# Patient Record
Sex: Male | Born: 1970 | Race: Black or African American | Hispanic: No | Marital: Single | Smoking: Current every day smoker
Health system: Southern US, Community
[De-identification: ages and names within clinical notes are randomized; demographics above are authoritative.]

## PROBLEM LIST (undated history)

## (undated) ENCOUNTER — Emergency Department (HOSPITAL_COMMUNITY): Admission: EM | Discharge status: Absent without leave | Payer: Self-pay

## (undated) DIAGNOSIS — F39 Unspecified mood [affective] disorder: Secondary | ICD-10-CM

## (undated) DIAGNOSIS — F209 Schizophrenia, unspecified: Secondary | ICD-10-CM

## (undated) DIAGNOSIS — R011 Cardiac murmur, unspecified: Secondary | ICD-10-CM

## (undated) DIAGNOSIS — F319 Bipolar disorder, unspecified: Secondary | ICD-10-CM

## (undated) DIAGNOSIS — R053 Chronic cough: Secondary | ICD-10-CM

## (undated) DIAGNOSIS — J069 Acute upper respiratory infection, unspecified: Secondary | ICD-10-CM

## (undated) DIAGNOSIS — R05 Cough: Secondary | ICD-10-CM

---

## 2014-10-25 NOTE — ED Notes (Signed)
[facility], c/o HI toward neighbors and SI w/o a plan.  Pt reports several [facility] admissions in the past.  Pt has been off all medications x "a long time."

## 2014-10-26 ENCOUNTER — Encounter (HOSPITAL_COMMUNITY): Payer: Self-pay | Admitting: Emergency Medicine

## 2014-10-26 ENCOUNTER — Emergency Department (HOSPITAL_COMMUNITY)
Admission: EM | Admit: 2014-10-26 | Discharge: 2014-10-26 | Disposition: A | Discharge status: Home / Self Care | Payer: Medicaid Other | Attending: Emergency Medicine | Admitting: Emergency Medicine

## 2014-10-26 DIAGNOSIS — F39 Unspecified mood [affective] disorder: Secondary | ICD-10-CM | POA: Diagnosis not present

## 2014-10-26 DIAGNOSIS — Z008 Encounter for other general examination: Secondary | ICD-10-CM | POA: Diagnosis present

## 2014-10-26 DIAGNOSIS — R011 Cardiac murmur, unspecified: Secondary | ICD-10-CM | POA: Diagnosis not present

## 2014-10-26 HISTORY — DX: Bipolar disorder, unspecified: F31.9

## 2014-10-26 HISTORY — DX: Schizophrenia, unspecified: F20.9

## 2014-10-26 HISTORY — DX: Cardiac murmur, unspecified: R01.1

## 2014-10-26 NOTE — ED Notes (Signed)
[facility] and wanted to be placed back on lexapro because he has been "lashing out at people' lately. Pt was told [facility] that since he hasn't been on lexapro for over 5 years that [facility]. Pt denies SI/HI at this time. Pt does not want to have to stay, he just wants to be able to be placed back on lexapro.

## 2014-10-26 NOTE — Discharge Instructions (Signed)
[facility] issues. Return immediately if you have any thought of harming yourself or anyone else

## 2014-10-26 NOTE — ED Provider Notes (Signed)
CSN: [id number]     Arrival date & time 10/26/14  1742 History   First MD Initiated Contact with Patient 10/26/14 1845     Chief Complaint  Patient presents with  . Medical Clearance   Patient [facility]" [facility]  (Consider location/radiation/quality/duration/timing/severity/associated sxs/prior Treatment) HPI [facility] Requesting to be on Lexapro, which she's been on in the past to help with mood disorder. He states he gets angry easily. He presently denies any wish to harm himself or others, and is asymptomatic. No treatment prior to coming here. He was on Lexapro 5 years ago and wishes to start treatment again for anger management. Past Medical History  Diagnosis Date  . Bipolar 1 disorder   . Schizophrenia   . Heart murmur    History reviewed. No pertinent past surgical history. No family history on file. History  Substance Use Topics  . Smoking status: Never Smoker   . Smokeless tobacco: Not on file  . Alcohol Use: No    admits to marijuana use. Denies other drug use Review of Systems  Constitutional: Negative.   HENT: Negative.   Respiratory: Negative.   Cardiovascular: Negative.   Gastrointestinal: Negative.   Musculoskeletal: Negative.   Skin: Negative.   Neurological: Negative.   Psychiatric/Behavioral: Positive for behavioral problems.  All other systems reviewed and are negative.     Allergies  Review of patient's allergies indicates no known allergies.  Home Medications   Prior to Admission medications   Not on File   BP 134/94 mmHg  Pulse 80  Temp(Src) 98 F (36.7 C) (Oral)  Resp 17  SpO2 98% Physical Exam  Constitutional: He is oriented to person, place, and time. He appears well-developed and well-nourished.  HENT:  Head: Normocephalic and atraumatic.  Eyes: Conjunctivae are normal. Pupils are equal, round, and reactive to light.  Neck: Neck supple. No tracheal deviation  present. No thyromegaly present.  Cardiovascular: Normal rate and regular rhythm.   No murmur heard. Pulmonary/Chest: Effort normal and breath sounds normal.  Abdominal: Soft. Bowel sounds are normal. He exhibits no distension. There is no tenderness.  Musculoskeletal: Normal range of motion. He exhibits no edema or tenderness.  Neurological: He is alert and oriented to person, place, and time. No cranial nerve deficit. Coordination normal.  Gait normal  Skin: Skin is warm and dry. No rash noted.  Psychiatric: He has a normal mood and affect.  Nursing note and vitals reviewed.   ED Course  Procedures (including critical care time) Labs Review Labs Reviewed  CBC WITH DIFFERENTIAL/PLATELET  COMPREHENSIVE METABOLIC PANEL  URINE RAPID DRUG SCREEN (HOSP PERFORMED)  ETHANOL    Imaging Review No results found.   EKG Interpretation None      MDM  Patient does not require laboratory evaluation. He admits to marijuana use. Final diagnoses:  None   [facility] mood disorder      Doug SouSam Charlesia Canaday, MD 10/27/14 [id number]

## 2014-10-31 ENCOUNTER — Emergency Department (HOSPITAL_COMMUNITY): Payer: Medicaid Other

## 2014-10-31 ENCOUNTER — Encounter (HOSPITAL_COMMUNITY): Payer: Self-pay | Admitting: *Deleted

## 2014-10-31 ENCOUNTER — Emergency Department (HOSPITAL_COMMUNITY)
Admission: EM | Admit: 2014-10-31 | Discharge: 2014-10-31 | Disposition: A | Discharge status: Home / Self Care | Payer: Medicaid Other | Attending: Emergency Medicine | Admitting: Emergency Medicine

## 2014-10-31 DIAGNOSIS — Z8659 Personal history of other mental and behavioral disorders: Secondary | ICD-10-CM | POA: Diagnosis not present

## 2014-10-31 DIAGNOSIS — Z720 Tobacco use: Secondary | ICD-10-CM | POA: Diagnosis not present

## 2014-10-31 DIAGNOSIS — R011 Cardiac murmur, unspecified: Secondary | ICD-10-CM | POA: Insufficient documentation

## 2014-10-31 DIAGNOSIS — R059 Cough, unspecified: Secondary | ICD-10-CM

## 2014-10-31 DIAGNOSIS — R05 Cough: Secondary | ICD-10-CM

## 2014-10-31 DIAGNOSIS — J069 Acute upper respiratory infection, unspecified: Secondary | ICD-10-CM | POA: Diagnosis not present

## 2014-10-31 NOTE — Discharge Instructions (Signed)
Continue to stay well-hydrated. Gargle warm salt water and spit it out. Continue to alternate between Tylenol and Ibuprofen for pain or fever. Use Mucinex for cough suppression/expectoration of mucus. Use netipot and flonase to help with nasal congestion. May consider over-the-counter Benadryl or other antihistamine to decrease secretions and for watery itchy eyes. Followup with your primary care doctor in 5-7 days for recheck of ongoing symptoms. Return to emergency department for emergent changing or worsening of symptoms.   Cough, Adult  A cough is a reflex. It helps you clear your throat and airways. A cough can help heal your body. A cough can last 2 or 3 weeks (acute) or may last more than 8 weeks (chronic). Some common causes of a cough can include an infection, allergy, or a cold. HOME CARE  Only take medicine as told by your doctor.  If given, take your medicines (antibiotics) as told. Finish them even if you start to feel better.  Use a cold steam vaporizer or humidifier in your home. This can help loosen thick spit (secretions).  Sleep so you are almost sitting up (semi-upright). Use pillows to do this. This helps reduce coughing.  Rest as needed.  Stop smoking if you smoke. GET HELP RIGHT AWAY IF:  You have yellowish-white fluid (pus) in your thick spit.  Your cough gets worse.  Your medicine does not reduce coughing, and you are losing sleep.  You cough up blood.  You have trouble breathing.  Your pain gets worse and medicine does not help.  You have a fever. MAKE SURE YOU:   Understand these instructions.  Will watch your condition.  Will get help right away if you are not doing well or get worse. Document Released: 02/26/2011 Document Revised: 10/30/2013 Document Reviewed: 02/26/2011 [facility] Patient Information 2015 [place], [place]. This information is not intended to replace advice given to you by your health care provider. Make sure you discuss any questions  you have with your health care provider.  Cool Mist Vaporizers Vaporizers may help relieve the symptoms of a cough and cold. They add moisture to the air, which helps mucus to become thinner and less sticky. This makes it easier to breathe and cough up secretions. Cool mist vaporizers do not cause serious burns like hot mist vaporizers, which may also be called steamers or humidifiers. Vaporizers have not been proven to help with colds. You should not use a vaporizer if you are allergic to mold. HOME CARE INSTRUCTIONS  Follow the package instructions for the vaporizer.  Do not use anything other than distilled water in the vaporizer.  Do not run the vaporizer all of the time. This can cause mold or bacteria to grow in the vaporizer.  Clean the vaporizer after each time it is used.  Clean and dry the vaporizer well before storing it.  Stop using the vaporizer if worsening respiratory symptoms develop. Document Released: 03/12/2004 Document Revised: 06/20/2013 Document Reviewed: 11/02/2012 [facility] Patient Information 2015 [place], [place]. This information is not intended to replace advice given to you by your health care provider. Make sure you discuss any questions you have with your health care provider.  Upper Respiratory Infection, Adult An upper respiratory infection (URI) is also known as the common cold. It is often caused by a type of germ (virus). Colds are easily spread (contagious). You can pass it to others by kissing, coughing, sneezing, or drinking out of the same glass. Usually, you get better in 1 or 2 weeks.  HOME CARE  Only take medicine as told by your doctor.  Use a warm mist humidifier or breathe in steam from a hot shower.  Drink enough water and fluids to keep your pee (urine) clear or pale yellow.  Get plenty of rest.  Return to work when your temperature is back to normal or as told by your doctor. You may use a face mask and wash your hands to stop your cold  from spreading. GET HELP RIGHT AWAY IF:   After the first few days, you feel you are getting worse.  You have questions about your medicine.  You have chills, shortness of breath, or brown or red spit (mucus).  You have yellow or brown snot (nasal discharge) or pain in the face, especially when you bend forward.  You have a fever, puffy (swollen) neck, pain when you swallow, or white spots in the back of your throat.  You have a bad headache, ear pain, sinus pain, or chest pain.  You have a high-pitched whistling sound when you breathe in and out (wheezing).  You have a lasting cough or cough up blood.  You have sore muscles or a stiff neck. MAKE SURE YOU:   Understand these instructions.  Will watch your condition.  Will get help right away if you are not doing well or get worse. Document Released: 12/02/2007 Document Revised: 09/07/2011 Document Reviewed: 09/20/2013 [facility] Patient Information 2015 [place], [place]. This information is not intended to replace advice given to you by your health care provider. Make sure you discuss any questions you have with your health care provider.

## 2014-10-31 NOTE — ED Notes (Signed)
The pt has had a cough for 5 days with yellow mucoous.  No temp  More congested at night.  And his girlfriend thinks his outer neck is swollen

## 2014-10-31 NOTE — ED Provider Notes (Signed)
CSN: [id number]     Arrival date & time 10/31/14  2059 History  This chart was scribed for Tyrone StraussMercedes Camprubi-Soms, PA-C, working with Tyrone BerkshireJoseph Zammit, MD by Chestine SporeSoijett Blue, ED Scribe. The patient was seen in room TR10C/TR10C at 10:16 PM.    Chief Complaint  Patient presents with  . Cough      Patient is a 44 y.o. male presenting with cough. The history is provided by the patient. No language interpreter was used.  Cough Cough characteristics:  Productive Sputum characteristics:  Yellow Severity:  Moderate Onset quality:  Gradual Duration:  5 days Timing:  Constant Progression:  Unchanged Chronicity:  New Smoker: yes (cigars)   Context: upper respiratory infection   Context: not sick contacts   Relieved by: benadryl. Worsened by:  Nothing tried Ineffective treatments:  None tried Associated symptoms: shortness of breath (due to cough), sinus congestion and wheezing (due to cough)   Associated symptoms: no chest pain, no chills, no ear fullness, no ear pain, no eye discharge, no fever, no headaches, no myalgias, no rash, no rhinorrhea and no sore throat   Risk factors: no recent travel     Tyrone Best is a 44 y.o. male with a medical hx of bipolar 1 disorder, schizophrenia, and heart murmur, who presents to the Emergency department complaining of cough onset 5 days. Pt cough is productive of yellow sputum and he notes that he is more congested at night. He states that he has tried benadryl with mild relief for his symptoms. He states that he is having associated symptoms of congestion, wheezing due to the cough, SOB due to the cough. Pt reports that his cough is worse at night.  He denies sore throat, trouble swallowing, drooling/trismus, ear pain/drainage, sinus congestion/rhinorrhea, CP, fever, chills, n/v/d, abdominal pain, leg swelling, and any other symptoms. Pt notes that he is a smoker of cigars. Pt denies sick contacts or recent travel. Denies medical hx of asthma or COPD.   Past  Medical History  Diagnosis Date  . Bipolar 1 disorder   . Schizophrenia   . Heart murmur    History reviewed. No pertinent past surgical history. No family history on file. History  Substance Use Topics  . Smoking status: Current Every Day Smoker  . Smokeless tobacco: Not on file  . Alcohol Use: No    Review of Systems  Constitutional: Negative for fever and chills.  HENT: Positive for congestion (chest). Negative for ear discharge, ear pain, rhinorrhea, sinus pressure, sore throat and trouble swallowing.   Eyes: Negative for discharge and itching.  Respiratory: Positive for cough, shortness of breath (due to cough) and wheezing (due to cough).   Cardiovascular: Negative for chest pain and leg swelling.  Gastrointestinal: Negative for nausea, vomiting, abdominal pain, diarrhea and constipation.  Musculoskeletal: Negative for myalgias, arthralgias and neck pain.  Skin: Negative for rash.  Allergic/Immunologic: Negative for immunocompromised state.  Neurological: Negative for weakness, numbness and headaches.  Hematological: Positive for adenopathy (neck).   A complete 10 system review of systems was obtained and all systems are negative except as noted in the HPI and PMH.    Allergies  Review of patient's allergies indicates no known allergies.  Home Medications   Prior to Admission medications   Not on File   BP 118/60 mmHg  Pulse 68  Temp(Src) 98.6 F (37 C)  Resp 18  SpO2 96%  Physical Exam  Constitutional: He is oriented to person, place, and time. Vital signs are normal. He  appears well-developed and well-nourished.  Non-toxic appearance. No distress.  Afebrile, nontoxic, NAD  HENT:  Head: Normocephalic and atraumatic.  Right Ear: Hearing, tympanic membrane, external ear and ear canal normal.  Left Ear: Hearing, tympanic membrane, external ear and ear canal normal.  Nose: Nose normal.  Mouth/Throat: Uvula is midline, oropharynx is clear and moist and mucous  membranes are normal. No trismus in the jaw. No uvula swelling.  Ears clear bilaterally, nose clear. Oropharynx clear without tonsillar swelling, exudate, or erythema. Uvula midline without swelling, no trismus, or drooling.   Eyes: Conjunctivae and EOM are normal. Right eye exhibits no discharge. Left eye exhibits no discharge.  Neck: Normal range of motion. Neck supple.  Cardiovascular: Normal rate, regular rhythm, normal heart sounds and intact distal pulses.  Exam reveals no gallop and no friction rub.   No murmur heard. Pulmonary/Chest: Effort normal and breath sounds normal. No respiratory distress. He has no decreased breath sounds. He has no wheezes. He has no rhonchi. He has no rales.  CTAB in all lung fields, no w/r/r, no hypoxia or increased WOB, speaking in full sentences, SpO2 96% on RA. Intermittent cough noted on exam.    Abdominal: Soft. Normal appearance and bowel sounds are normal. He exhibits no distension. There is no tenderness. There is no rigidity, no rebound and no guarding.  Musculoskeletal: Normal range of motion.  Lymphadenopathy:       Head (right side): No submandibular and no tonsillar adenopathy present.       Head (left side): No submandibular and no tonsillar adenopathy present.    He has cervical adenopathy.  Shotty cervical LAD bilaterally which is non-TTP. No submandibular or tonsillar LAD.   Neurological: He is alert and oriented to person, place, and time. He has normal strength. No sensory deficit.  Skin: Skin is warm, dry and intact. No rash noted.  Psychiatric: He has a normal mood and affect.  Nursing note and vitals reviewed.   ED Course  Procedures (including critical care time) DIAGNOSTIC STUDIES: Oxygen Saturation is 96% on RA, nl by my interpretation.    COORDINATION OF CARE: 10:21 PM-Discussed treatment plan which includes remain hydrated, rest, and f/u if the symptoms persists and worsen with pt at bedside and pt agreed to plan.   Labs  Review Labs Reviewed - No data to display  Imaging Review Dg Chest 2 View  10/31/2014   CLINICAL DATA:  Productive cough produced annual sputum for 5 days  EXAM: CHEST  2 VIEW  COMPARISON:  None.  FINDINGS: Normal mediastinum and cardiac silhouette. Normal pulmonary vasculature. No evidence of effusion, infiltrate, or pneumothorax. No acute bony abnormality.  IMPRESSION: Normal chest radiograph   Electronically Signed   By: Genevive BiStewart  Edmunds M.D.   On: 10/31/2014 21:57     EKG Interpretation None      MDM   Final diagnoses:  Cough  URI (upper respiratory infection)    44 y.o. male here with cough and shotty cervical LAD. Doubt strep or mono. Pt is afebrile with a clear lung exam. Likely viral URI. CXR obtained in triage negative. No hx of asthma/COPD, therefore doubt need for nebs or prednisone. Pt is agreeable to symptomatic treatment with close follow up with PCP as needed but spoke at length about emergent changing or worsening of symptoms that should prompt return to ER. Pt voices understanding and is agreeable to plan. Stable at time of discharge. Smoking cessation counseled.  I personally performed the services described in this  documentation, which was scribed in my presence. The recorded information has been reviewed and is accurate.  BP 118/60 mmHg  Pulse 68  Temp(Src) 98.6 F (37 C)  Resp 18  SpO2 96%    Sunita Demond Camprubi-Soms, PA-C 10/31/14 2231  Tyrone Berkshire, MD 11/01/14 1320

## 2014-11-04 ENCOUNTER — Emergency Department (HOSPITAL_COMMUNITY)
Admission: EM | Admit: 2014-11-04 | Discharge: 2014-11-04 | Disposition: A | Discharge status: Home / Self Care | Payer: Medicaid Other | Attending: Emergency Medicine | Admitting: Emergency Medicine

## 2014-11-04 ENCOUNTER — Encounter (HOSPITAL_COMMUNITY): Payer: Self-pay | Admitting: Emergency Medicine

## 2014-11-04 DIAGNOSIS — R011 Cardiac murmur, unspecified: Secondary | ICD-10-CM | POA: Insufficient documentation

## 2014-11-04 DIAGNOSIS — R1013 Epigastric pain: Secondary | ICD-10-CM | POA: Diagnosis not present

## 2014-11-04 DIAGNOSIS — Z79899 Other long term (current) drug therapy: Secondary | ICD-10-CM | POA: Insufficient documentation

## 2014-11-04 DIAGNOSIS — Z8659 Personal history of other mental and behavioral disorders: Secondary | ICD-10-CM | POA: Insufficient documentation

## 2014-11-04 DIAGNOSIS — Z8709 Personal history of other diseases of the respiratory system: Secondary | ICD-10-CM | POA: Diagnosis not present

## 2014-11-04 DIAGNOSIS — R109 Unspecified abdominal pain: Secondary | ICD-10-CM

## 2014-11-04 DIAGNOSIS — Z720 Tobacco use: Secondary | ICD-10-CM | POA: Insufficient documentation

## 2014-11-04 HISTORY — DX: Chronic cough: R05.3

## 2014-11-04 HISTORY — DX: Unspecified mood (affective) disorder: F39

## 2014-11-04 HISTORY — DX: Acute upper respiratory infection, unspecified: J06.9

## 2014-11-04 HISTORY — DX: Cough: R05

## 2014-11-04 LAB — CBC WITH DIFFERENTIAL/PLATELET
Basophils Absolute: 0.1 10*3/uL (ref 0.0–0.1)
Basophils Relative: 1 % (ref 0–1)
Eosinophils Absolute: 0.4 10*3/uL (ref 0.0–0.7)
Eosinophils Relative: 6 % — ABNORMAL HIGH (ref 0–5)
HCT: 41.6 % (ref 39.0–52.0)
Hemoglobin: 15.8 g/dL (ref 13.0–17.0)
Lymphocytes Relative: 52 % — ABNORMAL HIGH (ref 12–46)
Lymphs Abs: 3.3 10*3/uL (ref 0.7–4.0)
MCH: 30 pg (ref 26.0–34.0)
MCHC: 37.4 g/dL — ABNORMAL HIGH (ref 30.0–36.0)
MCV: 80 fL (ref 78.0–100.0)
Monocytes Absolute: 0.5 10*3/uL (ref 0.1–1.0)
Monocytes Relative: 8 % (ref 3–12)
Neutro Abs: 2.1 10*3/uL (ref 1.7–7.7)
Neutrophils Relative %: 33 % — ABNORMAL LOW (ref 43–77)
Platelets: 179 10*3/uL (ref 150–400)
RBC: 5.2 MIL/uL (ref 4.22–5.81)
RDW: 15 % (ref 11.5–15.5)
WBC: 6.3 10*3/uL (ref 4.0–10.5)

## 2014-11-04 LAB — URINALYSIS, ROUTINE W REFLEX MICROSCOPIC
Bilirubin Urine: NEGATIVE
Glucose, UA: NEGATIVE mg/dL
Hgb urine dipstick: NEGATIVE
Ketones, ur: 15 mg/dL — AB
Leukocytes, UA: NEGATIVE
Nitrite: NEGATIVE
Protein, ur: NEGATIVE mg/dL
Specific Gravity, Urine: 1.027 (ref 1.005–1.030)
Urobilinogen, UA: 1 mg/dL (ref 0.0–1.0)
pH: 6.5 (ref 5.0–8.0)

## 2014-11-04 LAB — COMPREHENSIVE METABOLIC PANEL
ALT: 13 U/L — ABNORMAL LOW (ref 17–63)
AST: 22 U/L (ref 15–41)
Albumin: 3.8 g/dL (ref 3.5–5.0)
Alkaline Phosphatase: 63 U/L (ref 38–126)
Anion gap: 8 (ref 5–15)
BUN: 8 mg/dL (ref 6–20)
CO2: 27 mmol/L (ref 22–32)
Calcium: 9.2 mg/dL (ref 8.9–10.3)
Chloride: 102 mmol/L (ref 101–111)
Creatinine, Ser: 1.13 mg/dL (ref 0.61–1.24)
GFR calc Af Amer: >60 mL/min (ref >60)
GFR calc non Af Amer: >60 mL/min (ref >60)
Glucose, Bld: 96 mg/dL (ref 70–99)
Potassium: 3.8 mmol/L (ref 3.5–5.1)
Sodium: 137 mmol/L (ref 135–145)
Total Bilirubin: 0.9 mg/dL (ref 0.3–1.2)
Total Protein: 7.1 g/dL (ref 6.5–8.1)

## 2014-11-04 LAB — LIPASE, BLOOD: Lipase: 49 U/L (ref 22–51)

## 2014-11-04 MED ORDER — FAMOTIDINE 20 MG PO TABS
20.0000 mg | ORAL_TABLET | Freq: Once | ORAL | Status: AC
  Administered 2014-11-04: 20 mg via ORAL
  Filled 2014-11-04: qty 1

## 2014-11-04 MED ORDER — GI COCKTAIL ~~LOC~~
30.0000 mL | Freq: Once | ORAL | Status: AC
  Administered 2014-11-04: 30 mL via ORAL
  Filled 2014-11-04: qty 30

## 2014-11-04 MED ORDER — PANTOPRAZOLE SODIUM 40 MG PO TBEC: 40.0000 mg | DELAYED_RELEASE_TABLET | Freq: Two times a day (BID) | ORAL | Status: AC

## 2014-11-04 MED ORDER — PANTOPRAZOLE SODIUM 40 MG PO TBEC
40.0000 mg | DELAYED_RELEASE_TABLET | Freq: Once | ORAL | Status: AC
  Administered 2014-11-04: 40 mg via ORAL
  Filled 2014-11-04: qty 1

## 2014-11-04 NOTE — Discharge Instructions (Signed)
Abdominal Pain °Many things can cause abdominal pain. Usually, abdominal pain is not caused by a disease and will improve without treatment. It can often be observed and treated at home. Your health care provider will do a physical exam and possibly order blood tests and X-rays to help determine the seriousness of your pain. However, in many cases, more time must pass before a clear cause of the pain can be found. Before that point, your health care provider may not know if you need more testing or further treatment. °HOME CARE INSTRUCTIONS  °Monitor your abdominal pain for any changes. The following actions may help to alleviate any discomfort you are experiencing: °· Only take over-the-counter or prescription medicines as directed by your health care provider. °· Do not take laxatives unless directed to do so by your health care provider. °· Try a clear liquid diet (broth, tea, or water) as directed by your health care provider. Slowly move to a bland diet as tolerated. °SEEK MEDICAL CARE IF: °· You have unexplained abdominal pain. °· You have abdominal pain associated with nausea or diarrhea. °· You have pain when you urinate or have a bowel movement. °· You experience abdominal pain that wakes you in the night. °· You have abdominal pain that is worsened or improved by eating food. °· You have abdominal pain that is worsened with eating fatty foods. °· You have a fever. °SEEK IMMEDIATE MEDICAL CARE IF:  °· Your pain does not go away within 2 hours. °· You keep throwing up (vomiting). °· Your pain is felt only in portions of the abdomen, such as the right side or the left lower portion of the abdomen. °· You pass bloody or black tarry stools. °MAKE SURE YOU: °· Understand these instructions.   °· Will watch your condition.   °· Will get help right away if you are not doing well or get worse.   °Document Released: 03/25/2005 Document Revised: 06/20/2013 Document Reviewed: 02/22/2013 °ExitCare® Patient Information  ©2015 [employer]. This information is not intended to replace advice given to you by your health care provider. Make sure you discuss any questions you have with your health care provider. ° ° °Emergency Department Resource Guide °1) Find a Doctor and Pay Out of Pocket °Although you won't have to find out who is covered by your insurance plan, it is a good idea to ask around and get recommendations. You will then need to call the office and see if the doctor you have chosen will accept you as a new patient and what types of options they offer for patients who are self-pay. Some doctors offer discounts or will set up payment plans for their patients who do not have insurance, but you will need to ask so you aren't surprised when you get to your appointment. ° °2) Contact Your Local Health Department °Not all health departments have doctors that can see patients for sick visits, but many do, so it is worth a call to see if yours does. If you don't know where your local health department is, you can check in your phone book. The CDC also has a tool to help you locate your state's health department, and many state websites also have listings of all of their local health departments. ° °3) Find a Walk-in Clinic °If your illness is not likely to be very severe or complicated, you may want to try a walk in clinic. These are popping up all over the country in pharmacies, drugstores, and shopping centers. They're   usually staffed by nurse practitioners or physician assistants that have been trained to treat common illnesses and complaints. They're usually fairly quick and inexpensive. However, if you have serious medical issues or chronic medical problems, these are probably not your best option. ° °No Primary Care Doctor: °- Call [facility] at  832-8000 - they can help you locate a primary care doctor that  accepts your insurance, provides certain services, etc. °- Physician Referral Service- 1-800-533-3463 ° °Chronic  Pain Problems: °Organization         Address  Phone   Notes  °[facility]  (336) 297-2271 Patients need to be referred by their primary care doctor.  ° °Medication Assistance: °Organization         Address  Phone   Notes  °[place] [facility] [address] °[place], [place] [address] (336) 641-8030 --Must be a resident of [place] °-- Must have NO insurance coverage whatsoever (no Medicaid/ Medicare, etc.) °-- The pt. MUST have a primary care doctor that directs their care regularly and follows them in the community °  °[facility]  (866) 331-1348   °[employer]  (888) 892-1162   ° °Agencies that provide inexpensive medical care: °Organization         Address  Phone   Notes  °[facility]  (336) 832-8035   °[facility]    (336) 832-7272   °[facility] [address] °[place], [place] [address] (336) 832-4777   °[facility] [address], °[place] (336) 271-4999   °[facility]    (336) 373-0678   °[facility]    (336) 272-1050   °[facility] ° [address], [place] Phone:  (336) 832-4444, Fax:  (336) 832-4440 Hours of Operation:  9 am - 6 pm, M-F.  Also accepts Medicaid/Medicare and self-pay.  °[facility] ° [address], [place] Phone: (336) 832-3150, Fax: (336) 832-3151. Hours of Operation:  8:30 am - 5:30 pm, M-F.  Also accepts Medicaid and self-pay.  °[facility] [place] [address], [place] Phone: (336) 878-6027   °[facility] [address], [place], [place] (336)723-1848, Ext. 123 Mondays & Thursdays: 7-9 AM.  First 15 patients are seen on a first come, first serve basis. °  ° °Medicaid-accepting [place] Providers: ° °Organization         Address  Phone   Notes  °[facility] [address], [place] (336) 641-2100 Also  accepts self-pay patients.  °[facility] [address], [place] ° (336) 856-9996   °[facility] [address], [place] (336) 288-8857   °[facility] [address] [place] [address], [place] (336) 299-7000   °Veita Bland [address], [place]  ° (336) 373-1557 Only accepts [place] Access Medicaid patients after they have their name applied to their card.  ° °Self-Pay (no insurance) in [place]: ° °Organization         Address  Phone   Notes  °Sickle Cell Patients, [facility] [address], [place] (336) 832-1970   °[facility] [address], [place] (336) 832-4400   ° [facility] ° [address], [place] (336) 992-4800   °[facility]/Dr. Osei-Bonsu ° [address] [place] [address], [place] or [address], [place] (336) 841-8500 Phone number for both [place] and [place] locations is the same.  °[facility] [address], [place] (336) 299-0000   °[facility] [address] [place] [address], [place] or [address] (336) 852-7530 °(336) 878-2260   °[facility] [address], [place] (336) 350-1642, phone; (336) 294-5005, fax Sees patients 1st and 3rd Saturday of every month.  Must not qualify for public or private insurance (i.e. Medicaid, Medicare, [place] Health Choice, Veterans' Benefits) • Household income should be no more than 200% of the poverty level •The clinic cannot treat you if you are pregnant or think you are pregnant • Sexually transmitted diseases are not treated at the clinic.  ° ° °Dental Care: °Organization         Address  Phone  Notes  °[facility] [address], [place] (336) 641-6152 Accepts children up to age 21 who are enrolled in Medicaid or [place] Health Choice; pregnant  women with a Medicaid card; and children who have applied for Medicaid or [place] Health Choice, but were declined, whose parents can pay a reduced fee at time of service.  °[facility] [place]  [address], [place] (336) 641-7733 Accepts children up to age 21 who are enrolled in Medicaid or [place] Health Choice; pregnant women with a Medicaid card; and children who have applied for Medicaid or [place] Health Choice, but were declined, whose parents can pay a reduced fee at time of service.  °[facility] ° [address], [place] (336) 641-4533 Patients are seen by appointment only. Walk-ins are not accepted. [facility] will see patients 18 years of age and older. °Monday - Tuesday (8am-5pm) °Most Wednesdays (8:30-5pm) °$30 per visit, cash only  °[facility] ° [address], [place] (336) 641-4533 Patients are seen by appointment only. Walk-ins are not accepted. [facility] will see patients 18 years of age and older. °One Wednesday Evening (Monthly: Volunteer Based).  $30 per visit, cash only  °[facility]  (919) 537-3737 for adults; Children under age 4, call Graduate Pediatric Dentistry at (919) [phone]. Children aged 4-14, please call (919) [phone] to request a pediatric application. ° Dental services are provided in all areas of dental care including fillings, crowns and bridges, complete and partial dentures, implants, gum treatment, root canals, and extractions. Preventive care is also provided. Treatment is provided to both adults and children. °Patients are selected via a lottery and there is often a waiting list. °  °[facility] [address], ° ° (336) 763-8833 [email or web address] °  °[facility] [address], [place], [place] (336)723-1848, Ext. 123 Second and Fourth Thursday of each month, opens at 6:30 AM; Clinic ends at 9 AM.  Patients are  seen on a first-come first-served basis, and a limited number are seen during each clinic.  ° °[facility] ° [address], [place], [place] (336) 723-7904   Eligibility Requirements °You must have lived in [place], [place], or [place] counties for at least the last three months. °  You cannot be eligible for state or federal sponsored healthcare insurance, including Veterans Administration, Medicaid, or Medicare. °  You generally cannot be eligible for healthcare insurance through   your employer.  °  How to apply: °Eligibility screenings are held every Tuesday and Wednesday afternoon from 1:00 pm until 4:00 pm. You do not need an appointment for the interview!  °[facility] [address], [place], [place] 336-631-2330   °[facility] [facility]  336-342-8273   °[facility]  336-703-3100   °[facility]  336-570-6415   ° °Behavioral Health Resources in the Community: °Intensive Outpatient Programs °Organization         Address  Phone  Notes  °[facility] [address], [place], [place] 336-878-6098   °[facility] [address], [place], [place] 336-832-9800   °[facility] [address], [place], [place] ° 336-882-2125   °[facility] [address],  °[place], [place] 1-800-853-5163 or 336-641-4981   °Substance Abuse Resources °Organization         Address  Phone  Notes  °[facility]  336-882-2125   °[facility]  336-784-9470   °[facility]  336-285-9073   °[facility]  336-845-3988   °Residential & Outpatient Substance Abuse Program  1-800-659-3381   °Psychological Services °Organization         Address  Phone  Notes  °[facility]  336- 832-9600   °[facility]  336- 378-7881   °[facility] [address], [place] 1-800-853-5163 or 336-641-4981   ° °Mobile Crisis  Teams °Organization         Address  Phone  Notes  °[facility], Mobile Crisis Care Unit  1-877-626-1772   °Assertive °[facility] ° [address] [place], [place] 336-834-9664   °Sharon DeEsch [address] °[place] 336-554-5454   ° °Self-Help/Support Groups °Organization         Address  Phone             Notes  °[facility] - variety of support groups  336- 373-1402 Call for more information  °Narcotics Anonymous (NA), [facility] [address], °[place]  2 meetings at this location  ° °Residential Treatment Programs °Organization         Address  Phone  Notes  °[facility] [address],    °[place]  1-866-801-8205   °[facility] ° [address], [place], [place] 704-293-8524   °[facility] [address], [place] 336-845-3988 Admissions: 8am-3pm M-F  °[facility] [address]    °[place], [place] 336-841-1104   °[facility] [address], [place], [place] 336-379-7146   °[facility] [address]  °[place], [place] 336-285-9073   °[facility] Intensive Outpatient [address], [place], [place] 336-852-3033   °[facility] [address]  °[place], [place] 1-877-615-2722 or 336-784-9470   °[facility] [address] [place], [place] 336-227-7417 Accepts Medicaid  °[facility] [address]  °[place] 1-800-659-3381 Substance Abuse/Addiction Treatment  ° °[place] Behavioral Health Resources °Organization         Address  Phone  Notes  °[facility]  (888) 581-9988   °Julie Brannon, PhD [address] [place],    (336) 349-5553 or (336) 951-0000   °[facility]   [address] °[place],  (336) 349-4454   °  [facility] [address], [place], [place] (336) 342-8316  Insurance/Medicaid/sponsorship through [facility]  °[facility] [address]                                    [place], [place] (336) 342-8316 Therapy/tele-psych/case  °[facility] [address]  ° [place], [place] (336) 349-2233    °Dr. Arfeen  (336) 349-4544   °[facility] [place]  [employer] [place] [facility] 1) [address], [place] °2) [address], [place] °3)  [address], [place] (336) 349-3220 °(336) 342-7768 ° °(336) 342-8140   °[place] [facility] (336) 342-1394 or (336) 342-3537 (After Hours)    ° ° ° ° °

## 2014-11-04 NOTE — ED Notes (Addendum)
Pt. reports low abdominal pain radiating to both groin with nausea , vomitting , diarrhea and productive cough onset this week. Denies fever or chills.

## 2014-11-04 NOTE — ED Notes (Signed)
Discharge instructions and prescription reviewed, voiced understanding. 

## 2014-11-04 NOTE — ED Notes (Signed)
Patient presents with c/o lower abd for about 6 months.  Saw his PMD and was told he had reflux but he stated "I know what reflux is and this is different" "I know I have an ulcer".  Denies urinary symptoms, no penile discharge, no difficulty with bowels.  States the pain is so bad that it hurts down into his groin and it makes him "ball up in pain".

## 2014-11-08 NOTE — ED Provider Notes (Signed)
CSN: [id number]     Arrival date & time 11/04/14  1920 History   First MD Initiated Contact with Patient 11/04/14 1947     Chief Complaint  Patient presents with  . Abdominal Pain     (Consider location/radiation/quality/duration/timing/severity/associated sxs/prior Treatment) HPI   44 year old male with abdominal pain. Ongoing for 6 months. Intermittent. "Deep, bad pain." Pain is diffuse but worse the upper abdomen. Worse shortly after eating. Associated with bloating sensation. Nausea. No vomiting. No urinary complaints. No diarrhea. Patient reports a past history of gastroesophageal reflux, but is not currently on any medications for this.   Past Medical History  Diagnosis Date  . Bipolar 1 disorder   . Schizophrenia   . Heart murmur   . Mood disorder   . URI (upper respiratory infection)   . Chronic cough    History reviewed. No pertinent past surgical history. No family history on file. History  Substance Use Topics  . Smoking status: Current Every Day Smoker  . Smokeless tobacco: Not on file  . Alcohol Use: No    Review of Systems  All systems reviewed and negative, other than as noted in HPI.   Allergies  Review of patient's allergies indicates no known allergies.  Home Medications   Prior to Admission medications   Medication Sig Start Date End Date Taking? Authorizing Provider  Ascorbic Acid (VITAMIN C ER PO) Take 1 tablet by mouth daily.   Yes Historical Provider, MD  guaiFENesin (ROBITUSSIN) 100 MG/5ML liquid Take 200 mg by mouth 3 (three) times daily as needed for cough.   Yes Historical Provider, MD  pantoprazole (PROTONIX) 40 MG tablet Take 1 tablet (40 mg total) by mouth 2 (two) times daily. 11/04/14   Raeford RazorStephen Elzina Devera, MD   BP 123/77 mmHg  Pulse 62  Temp(Src) 98 F (36.7 C) (Oral)  Resp 14  Ht 6\' 1"  (1.854 m)  Wt 183 lb 1.6 oz (83.054 kg)  BMI 24.16 kg/m2  SpO2 98% Physical Exam  Constitutional: He appears well-developed and well-nourished. No  distress.  HENT:  Head: Normocephalic and atraumatic.  Eyes: Conjunctivae are normal. Right eye exhibits no discharge. Left eye exhibits no discharge.  Neck: Neck supple.  Cardiovascular: Normal rate, regular rhythm and normal heart sounds.  Exam reveals no gallop and no friction rub.   No murmur heard. Pulmonary/Chest: Effort normal and breath sounds normal. No respiratory distress.  Abdominal: Soft. He exhibits no distension. There is tenderness. There is no rebound and no guarding.  Epigastric tenderness without rebound or guarding. No distention.  Musculoskeletal: He exhibits no edema or tenderness.  Neurological: He is alert.  Skin: Skin is warm and dry.  Psychiatric: He has a normal mood and affect. His behavior is normal. Thought content normal.  Nursing note and vitals reviewed.   ED Course  Procedures (including critical care time) Labs Review Labs Reviewed  CBC WITH DIFFERENTIAL/PLATELET - Abnormal; Notable for the following:    MCHC 37.4 (*)    Neutrophils Relative % 33 (*)    Lymphocytes Relative 52 (*)    Eosinophils Relative 6 (*)    All other components within normal limits  COMPREHENSIVE METABOLIC PANEL - Abnormal; Notable for the following:    ALT 13 (*)    All other components within normal limits  URINALYSIS, ROUTINE W REFLEX MICROSCOPIC - Abnormal; Notable for the following:    Color, Urine AMBER (*)    Ketones, ur 15 (*)    All other components within normal limits  LIPASE, BLOOD    Imaging Review No results found.   EKG Interpretation None      MDM   Final diagnoses:  Abdominal pain, unspecified abdominal location        Raeford RazorStephen Jaaron Oleson, MD 11/08/14 2025

## 2016-09-14 IMAGING — CR DG CHEST 2V
2 series · 2 of 2 positions shown · non-contrast
Comparison: None.

CLINICAL DATA: Productive cough produced annual sputum for 5 days

EXAM:
CHEST  2 VIEW

[w chest pa]
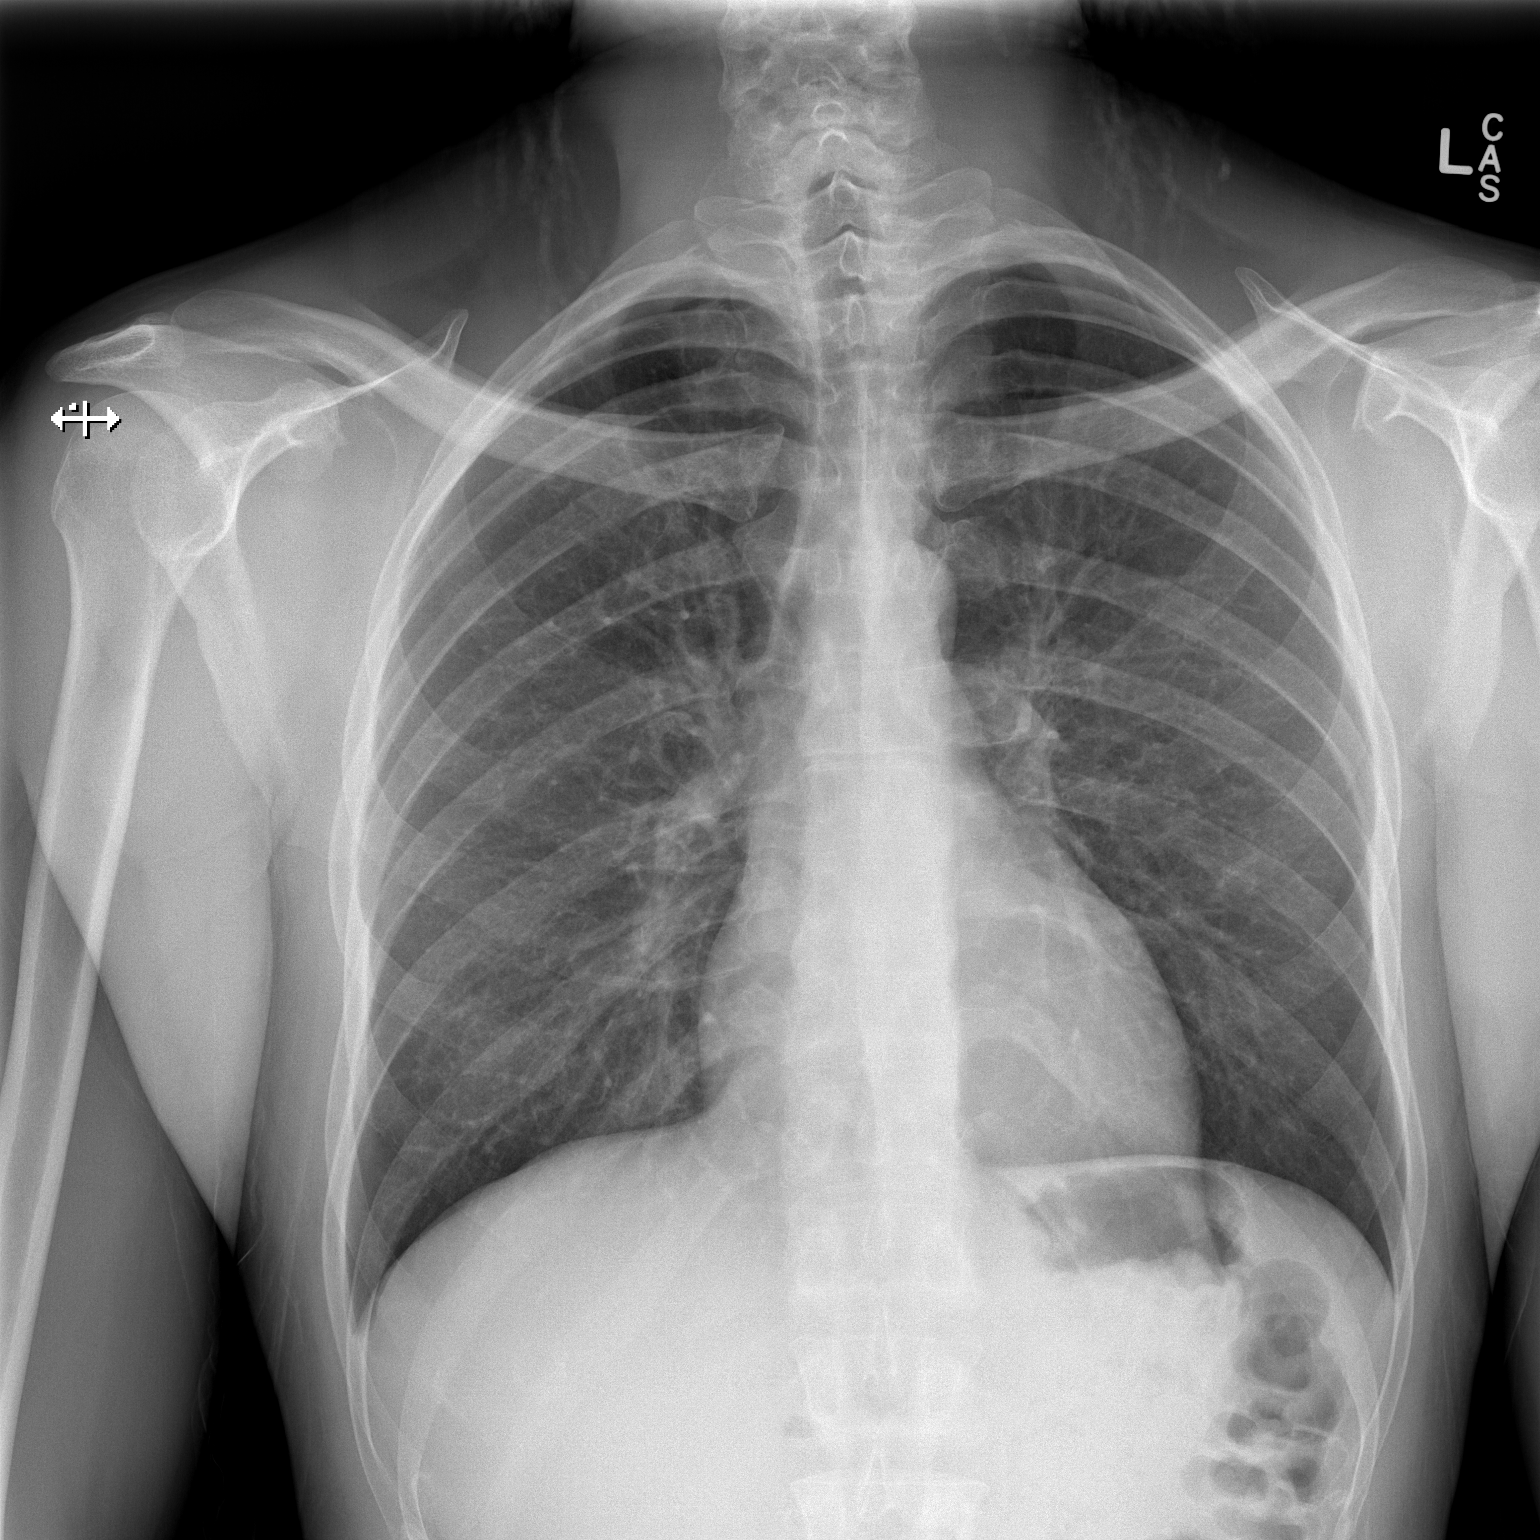

[w chest lat]
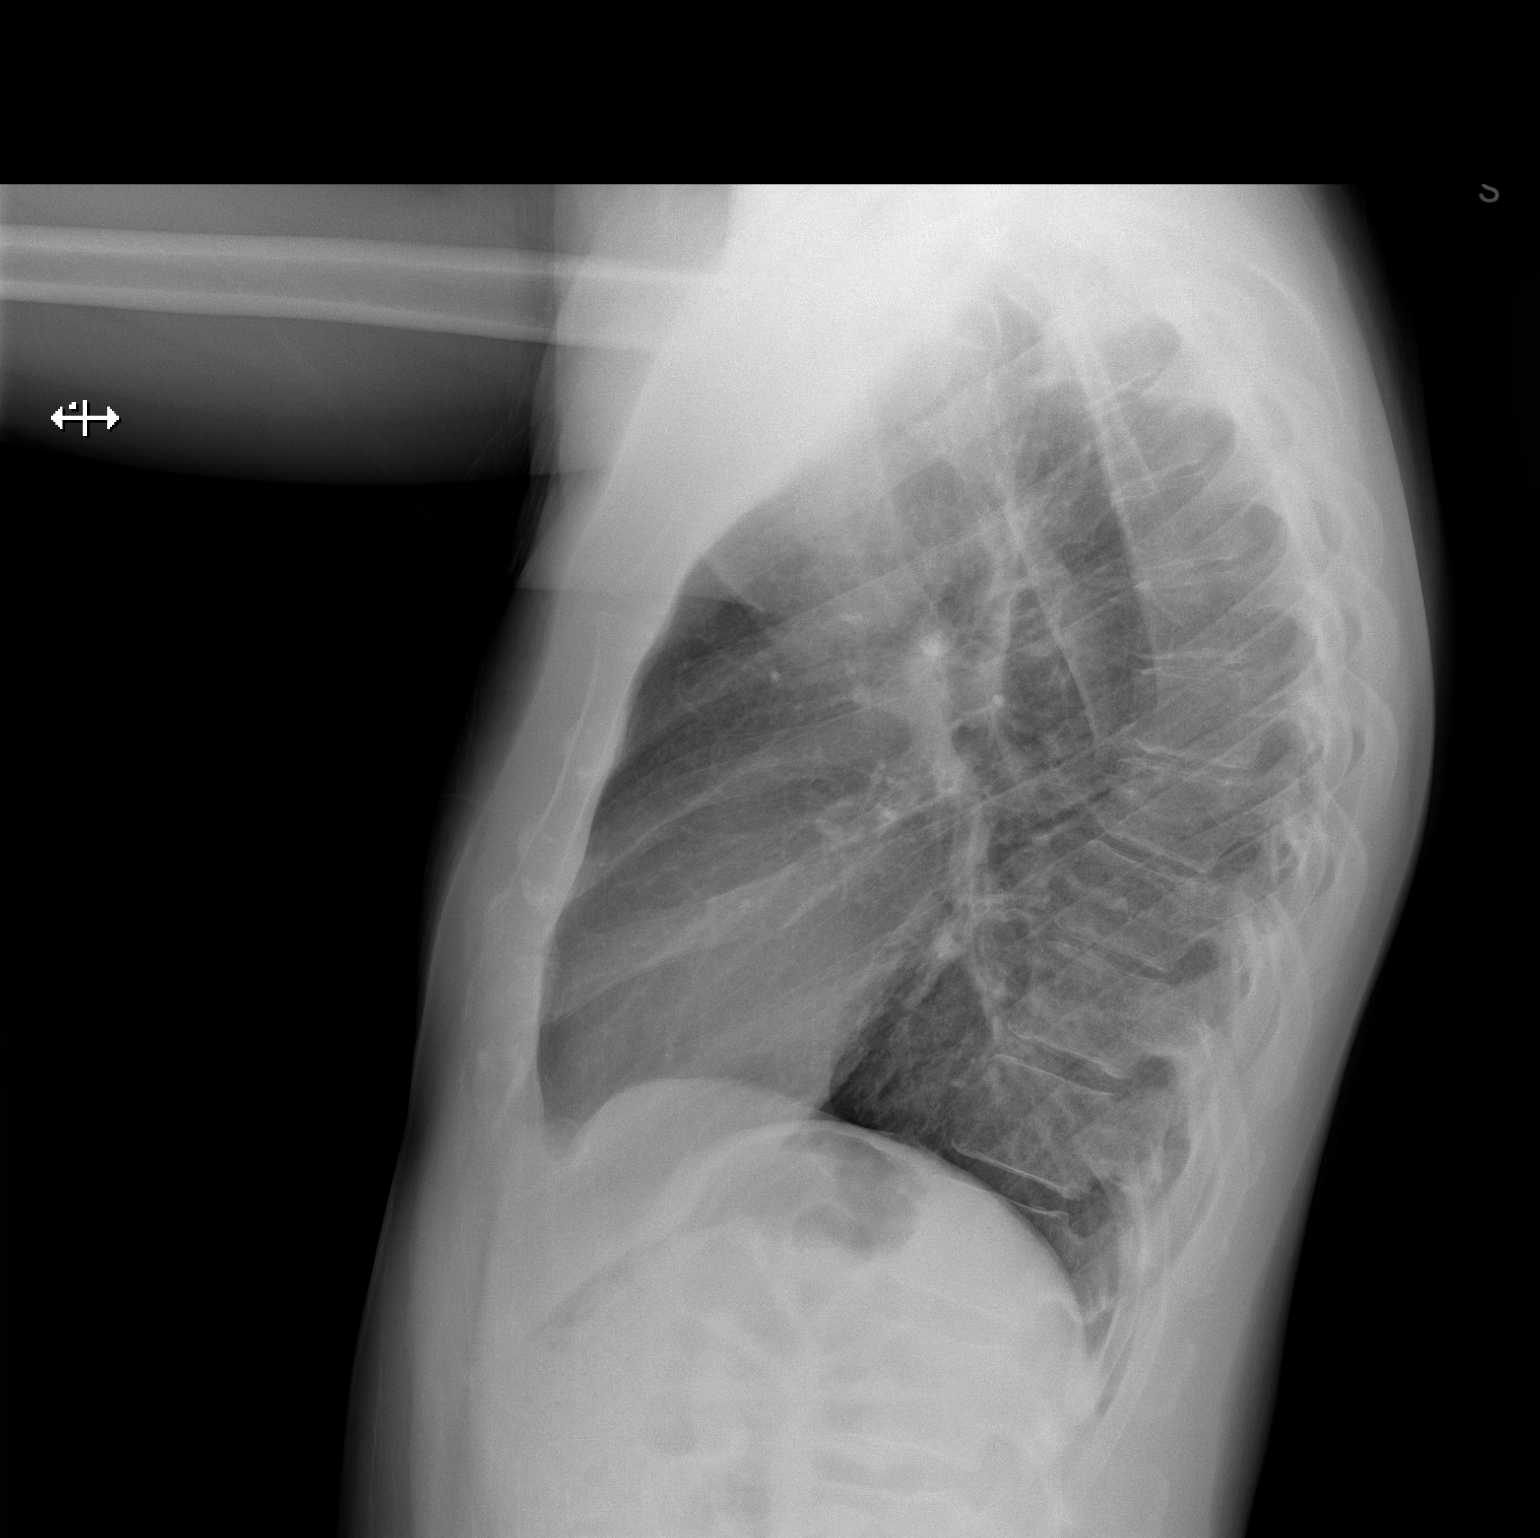

[2 of 2 positions shown; findings below may reference images not displayed]

FINDINGS: Normal mediastinum and cardiac silhouette. Normal pulmonary
vasculature. No evidence of effusion, infiltrate, or pneumothorax.
No acute bony abnormality.
IMPRESSION: Normal chest radiograph

## 2016-09-24 ENCOUNTER — Encounter (HOSPITAL_COMMUNITY): Payer: Self-pay | Admitting: *Deleted

## 2016-09-24 ENCOUNTER — Emergency Department (HOSPITAL_COMMUNITY)
Admission: EM | Admit: 2016-09-24 | Discharge: 2016-09-25 | Disposition: A | Discharge status: Home / Self Care | Payer: Medicaid Other | Attending: Emergency Medicine | Admitting: Emergency Medicine

## 2016-09-24 DIAGNOSIS — F918 Other conduct disorders: Secondary | ICD-10-CM | POA: Diagnosis present

## 2016-09-24 DIAGNOSIS — Z008 Encounter for other general examination: Secondary | ICD-10-CM

## 2016-09-24 DIAGNOSIS — F17210 Nicotine dependence, cigarettes, uncomplicated: Secondary | ICD-10-CM | POA: Diagnosis not present

## 2016-09-24 DIAGNOSIS — F439 Reaction to severe stress, unspecified: Secondary | ICD-10-CM | POA: Insufficient documentation

## 2016-09-24 DIAGNOSIS — F329 Major depressive disorder, single episode, unspecified: Secondary | ICD-10-CM | POA: Diagnosis not present

## 2016-09-24 NOTE — ED Triage Notes (Signed)
[facility] & [facility] they go to the ER.  Pt has been off meds x 2-3 months.
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
## 2016-09-24 NOTE — ED Notes (Signed)
Pt stated "I don't want to stay.  People ask me how I feel and I tell them I feel and then they try to keep me.  It will be worse when I get out if they make me stay.  I want to talk to the doctor again."  Clydie BraunKaren, PA-C informed pt requesting to speak to her again.

## 2016-09-24 NOTE — ED Notes (Signed)
Per Clydie BraunKaren, PA-C hold off on drawing labs until seen by TTS.
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
## 2016-09-24 NOTE — ED Notes (Signed)
TTS assessment in progress. 

## 2016-09-24 NOTE — Discharge Instructions (Signed)
Follow [facility]

## 2016-09-24 NOTE — ED Notes (Signed)
TTS assessment still in progress.
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
## 2016-09-24 NOTE — ED Notes (Signed)
Pt doesn't want to change into scrubs until the EDP or PA come and talk to him.

## 2016-09-24 NOTE — ED Notes (Signed)
[facility] & stating "he told me I could come there, but go home, get the kids and stay in a hotel for the weekend."
